# Patient Record
Sex: Male | Born: 1970 | Race: White | Hispanic: No | Marital: Single | Smoking: Current every day smoker
Health system: Southern US, Community
[De-identification: ages and names within clinical notes are randomized; demographics above are authoritative.]

---

## 2000-02-15 ENCOUNTER — Encounter: Admission: RE | Admit: 2000-02-15 | Discharge: 2000-02-15 | Payer: Self-pay | Admitting: Family Medicine

## 2000-02-15 ENCOUNTER — Encounter: Payer: Self-pay | Admitting: Family Medicine

## 2002-01-07 ENCOUNTER — Ambulatory Visit (HOSPITAL_COMMUNITY): Admission: RE | Admit: 2002-01-07 | Discharge: 2002-01-07 | Payer: Self-pay | Admitting: Emergency Medicine

## 2002-01-07 ENCOUNTER — Encounter: Payer: Self-pay | Admitting: Emergency Medicine

## 2002-02-14 ENCOUNTER — Encounter: Payer: Self-pay | Admitting: Emergency Medicine

## 2002-02-14 ENCOUNTER — Inpatient Hospital Stay (HOSPITAL_COMMUNITY): Admission: EM | Admit: 2002-02-14 | Discharge: 2002-02-20 | Payer: Self-pay | Admitting: Emergency Medicine

## 2002-02-16 ENCOUNTER — Encounter: Payer: Self-pay | Admitting: Internal Medicine

## 2010-08-14 ENCOUNTER — Encounter: Payer: Self-pay | Admitting: *Deleted

## 2012-02-04 ENCOUNTER — Ambulatory Visit: Payer: Self-pay | Admitting: Emergency Medicine

## 2013-02-20 ENCOUNTER — Emergency Department: Payer: Self-pay | Admitting: Unknown Physician Specialty

## 2013-02-20 LAB — CBC
HCT: 43.2 % (ref 40.0–52.0)
HGB: 15.4 g/dL (ref 13.0–18.0)
MCH: 32 pg (ref 26.0–34.0)
MCHC: 35.6 g/dL (ref 32.0–36.0)
MCV: 90 fL (ref 80–100)
Platelet: 319 10*3/uL (ref 150–440)
RBC: 4.8 10*6/uL (ref 4.40–5.90)
RDW: 14 % (ref 11.5–14.5)
WBC: 7.3 10*3/uL (ref 3.8–10.6)

## 2013-02-20 LAB — COMPREHENSIVE METABOLIC PANEL
Albumin: 3.9 g/dL (ref 3.4–5.0)
Alkaline Phosphatase: 60 U/L (ref 50–136)
Anion Gap: 6 — ABNORMAL LOW (ref 7–16)
BUN: 14 mg/dL (ref 7–18)
Bilirubin,Total: 0.5 mg/dL (ref 0.2–1.0)
Calcium, Total: 9.2 mg/dL (ref 8.5–10.1)
Chloride: 106 mmol/L (ref 98–107)
Co2: 25 mmol/L (ref 21–32)
Creatinine: 0.74 mg/dL (ref 0.60–1.30)
EGFR (African American): >60
EGFR (Non-African Amer.): >60
Glucose: 95 mg/dL (ref 65–99)
Osmolality: 274 (ref 275–301)
Potassium: 4.1 mmol/L (ref 3.5–5.1)
SGOT(AST): 30 U/L (ref 15–37)
SGPT (ALT): 32 U/L (ref 12–78)
Sodium: 137 mmol/L (ref 136–145)
Total Protein: 7.3 g/dL (ref 6.4–8.2)

## 2013-02-20 LAB — TROPONIN I: Troponin-I: <0.02 ng/mL

## 2013-06-17 IMAGING — CR DG WRIST COMPLETE 3+V*R*
1 series · 4 of 4 positions shown · non-contrast
Comparison: none

REASON FOR EXAM: pain 4 and 5 metacarpal extends to forearm
COMMENTS:

PROCEDURE:     MDR - MDR WRIST RT COMP WITH OBLIQUES  - February 04, 2012 [DATE]
RESULT:     There is no evidence of fracture, dislocation, or malalignment.

[Series 1: pa · 0.17mm/px · 4 of 4 slices shown]
[im 1/4]
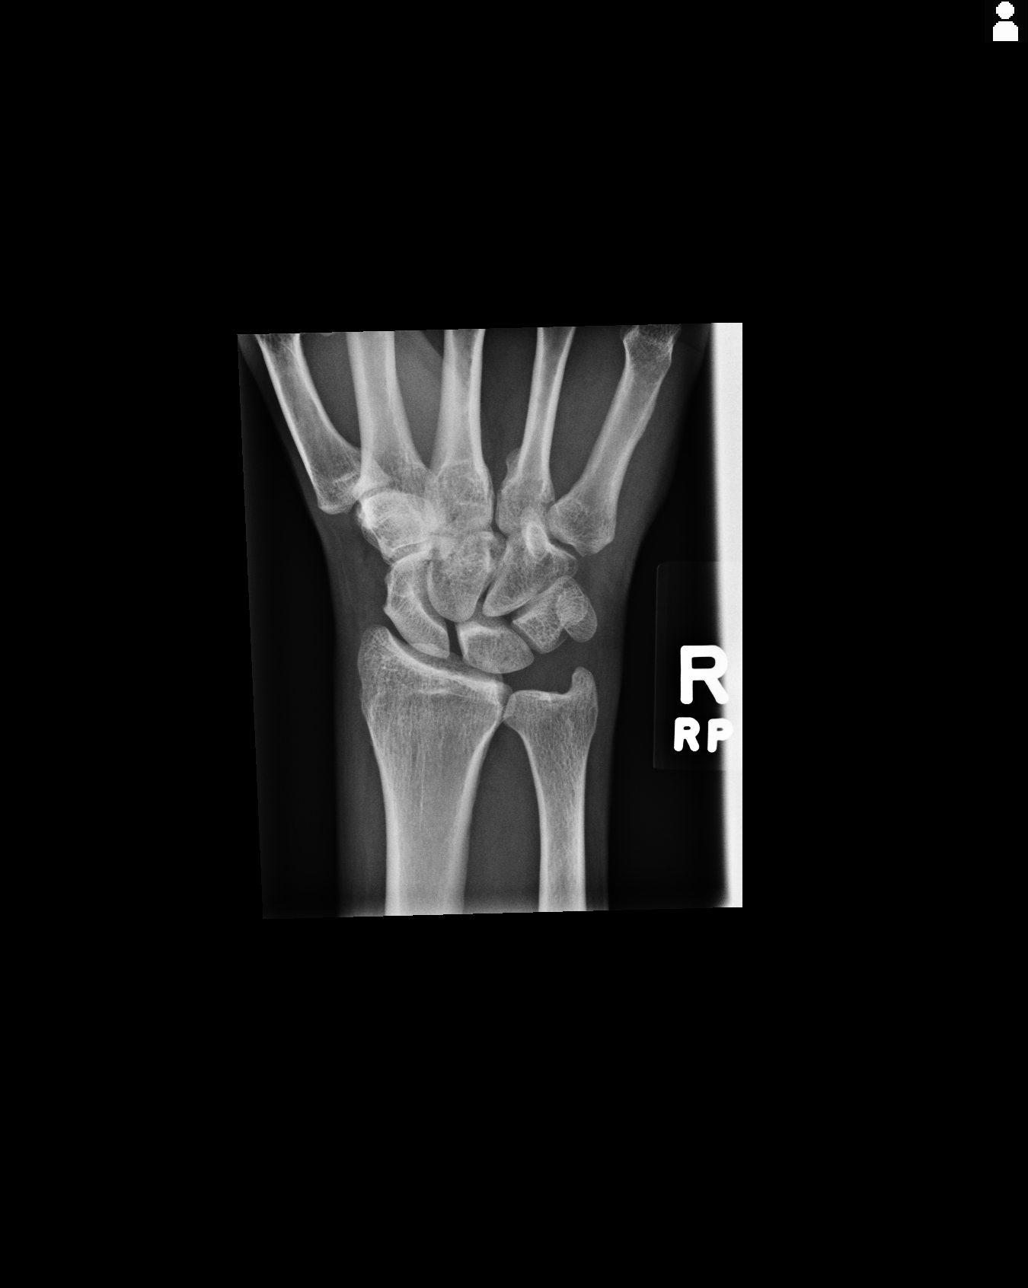
[im 2/4]
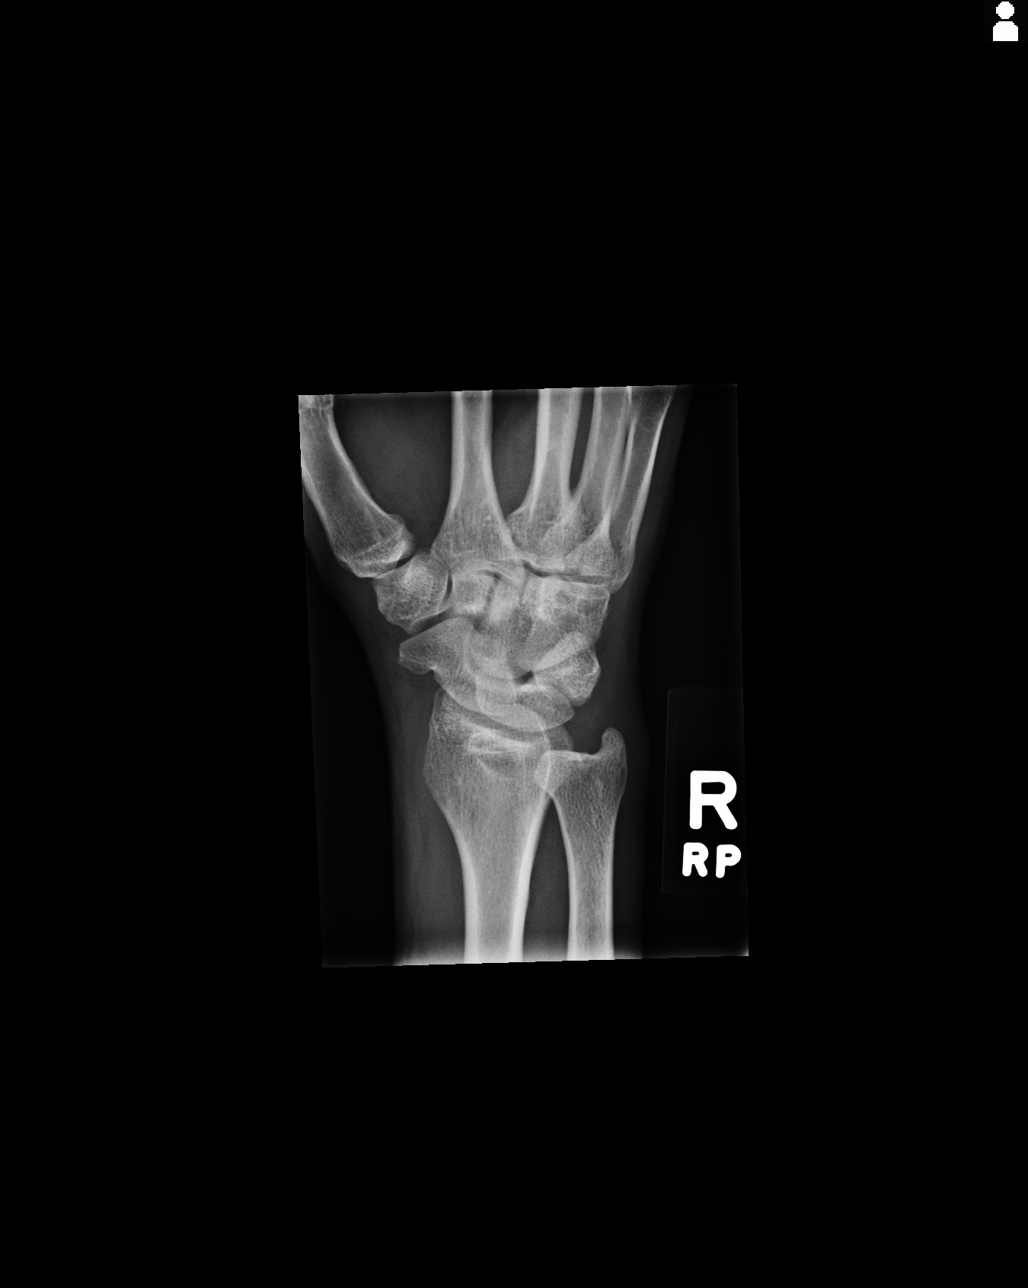
[im 3/4]
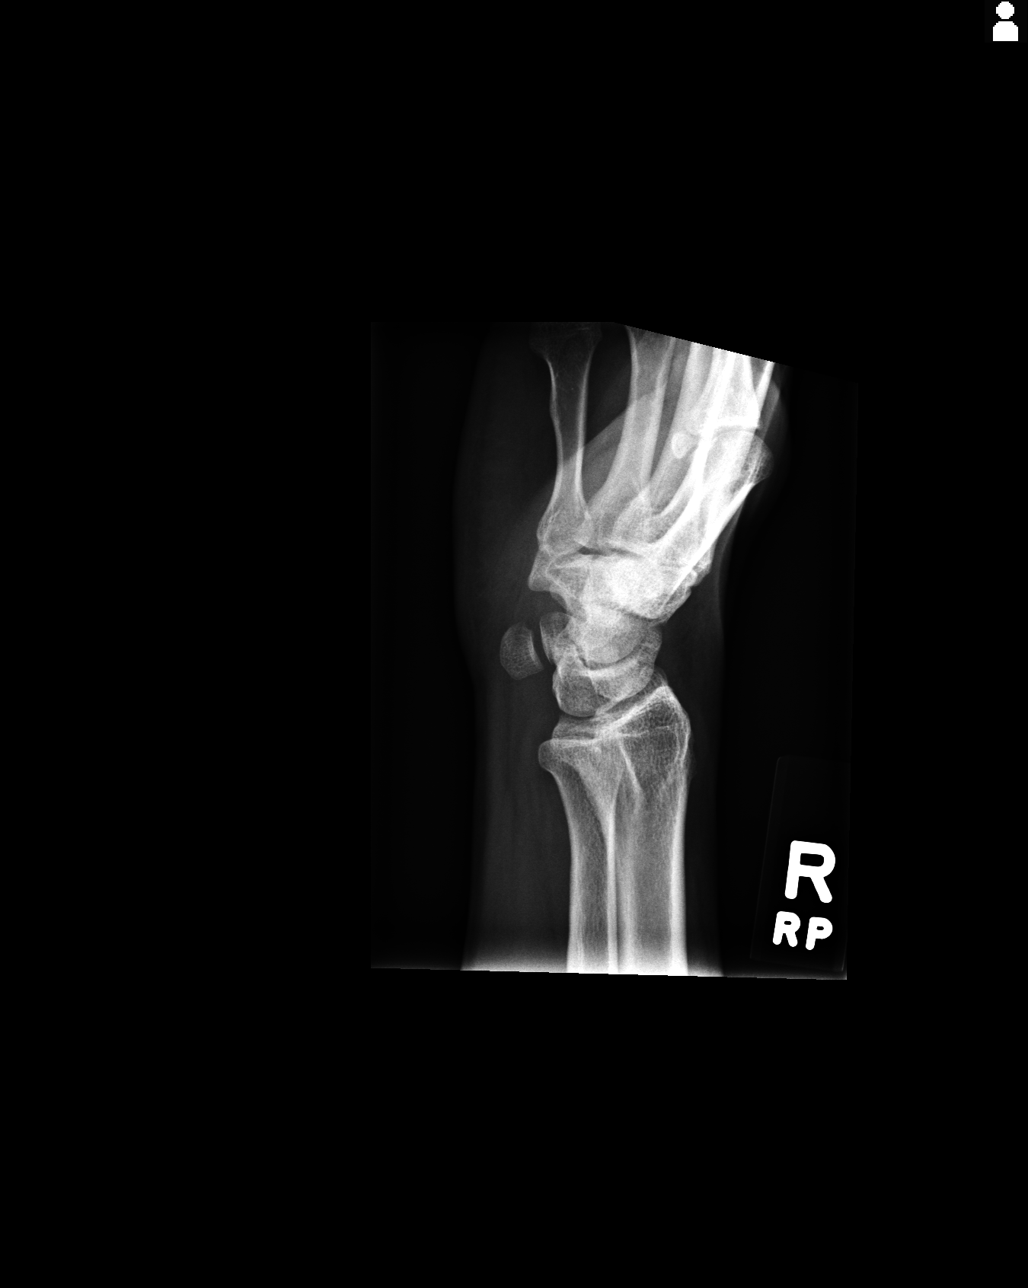
[im 4/4]
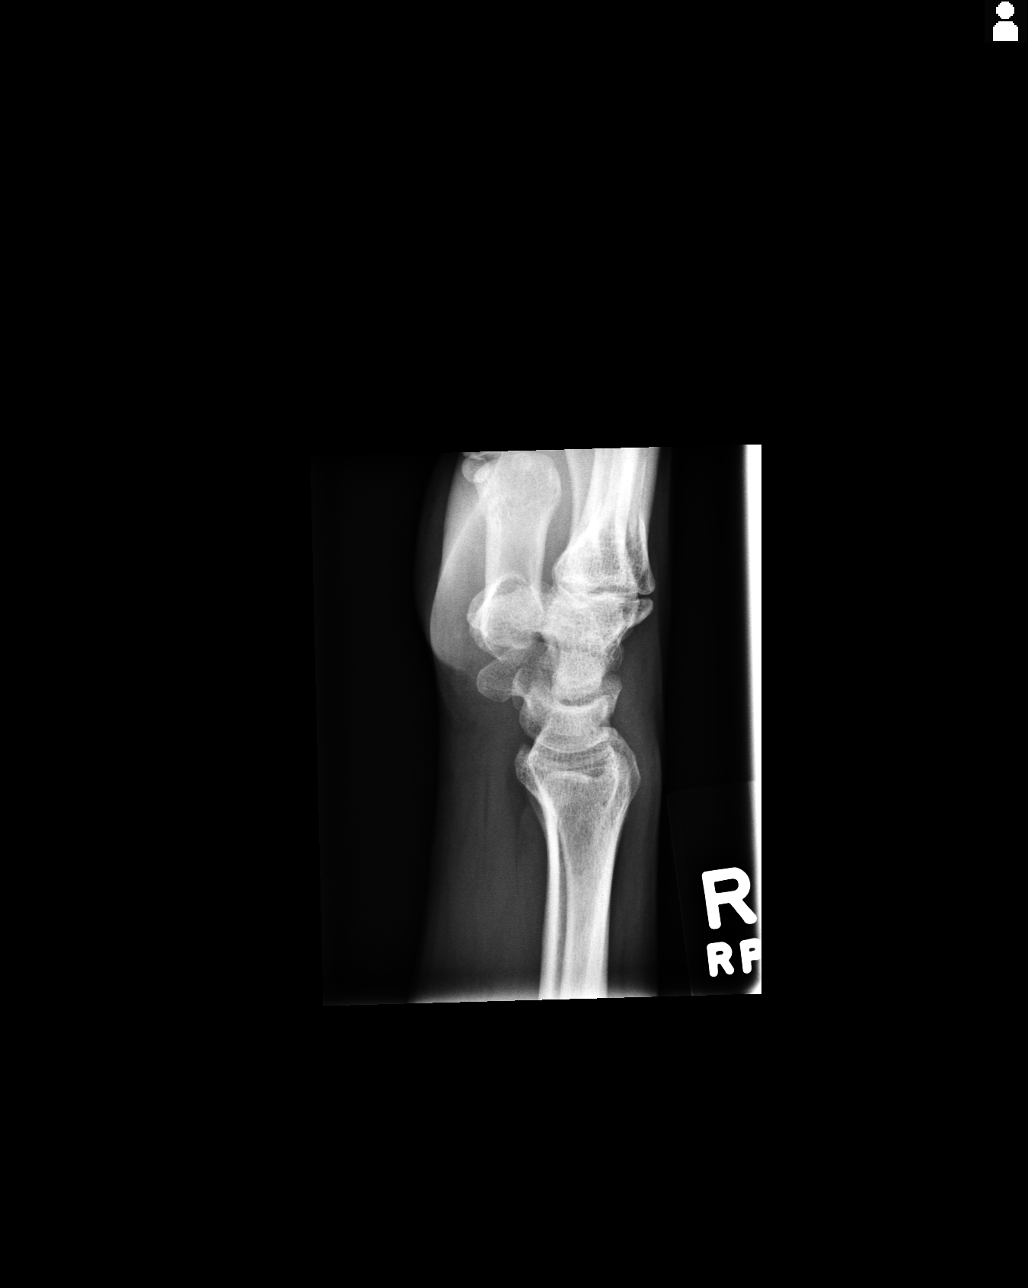

[4 of 4 positions shown; findings below may reference images not displayed]

IMPRESSION: 1. No evidence of acute abnormalities.
2. If there are persistent complaints of pain or persistent clinical
concern, a repeat evaluation in 7-10 days is recommended if clinically
warranted.

## 2014-02-06 ENCOUNTER — Emergency Department: Payer: Self-pay | Admitting: Emergency Medicine

## 2014-07-04 IMAGING — CR DG CHEST 1V PORT
1 series · 2 of 2 positions shown · non-contrast
Comparison: none

REASON FOR EXAM: cp
COMMENTS:

[Series 1: ap · 0.17mm/px · 2 of 2 slices shown]
[im 1/2]
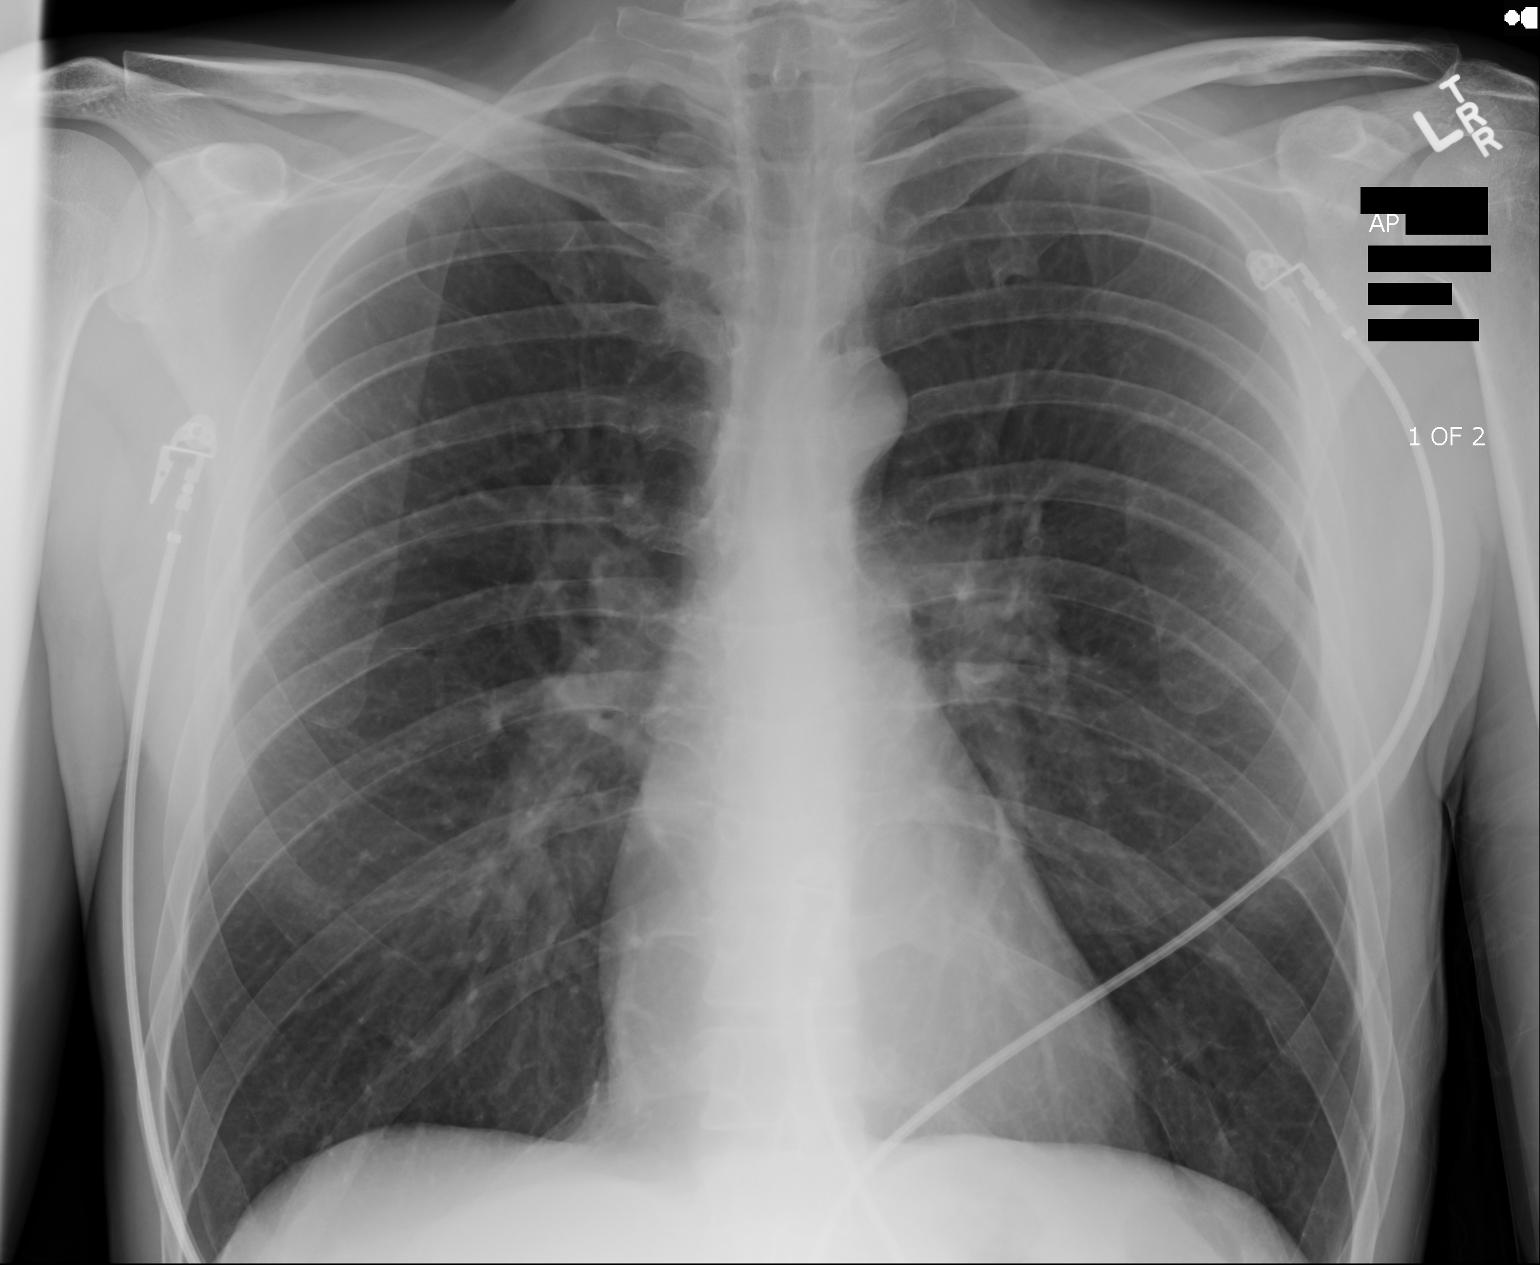
[im 2/2]
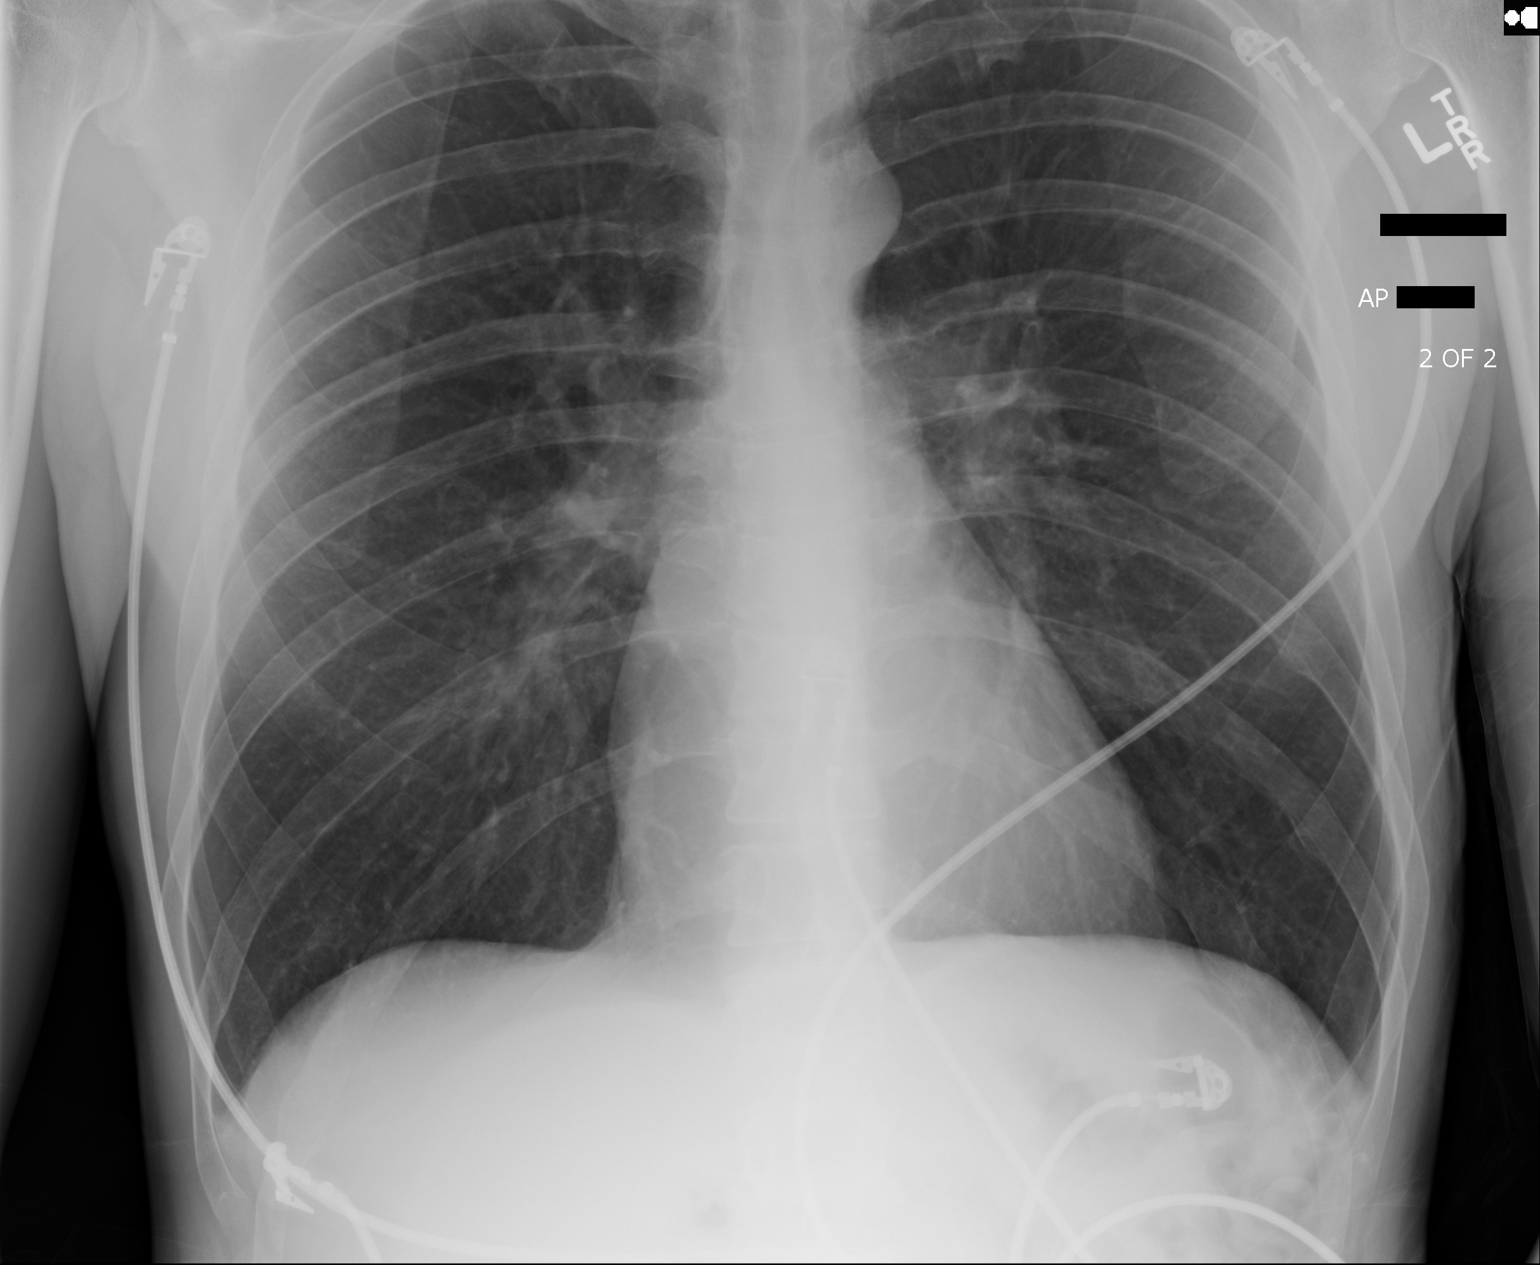

[2 of 2 positions shown; findings below may reference images not displayed]

PROCEDURE:     DXR - DXR PORTABLE CHEST SINGLE VIEW  - February 20, 2013  [DATE]

RESULT:     The lungs are mildly hyperinflated. There is slight hilar
prominence greater on the left than the right. The lungs are otherwise
clear. There is no effusion or pneumothorax. There is no diffuse
interstitial edema. The bony structures are unremarkable. The cardiac
silhouette is normal.
IMPRESSION: Mild hyperinflation suggestive of possible COPD. Hazy
increased density in the left hilar region. Correlate for perihilar
pneumonia.

[REDACTED]

## 2017-02-11 ENCOUNTER — Emergency Department
Admission: EM | Admit: 2017-02-11 | Discharge: 2017-02-11 | Disposition: A | Discharge status: Home / Self Care | Payer: PRIVATE HEALTH INSURANCE | Attending: Emergency Medicine | Admitting: Emergency Medicine

## 2017-02-11 ENCOUNTER — Encounter: Payer: Self-pay | Admitting: Emergency Medicine

## 2017-02-11 DIAGNOSIS — F17200 Nicotine dependence, unspecified, uncomplicated: Secondary | ICD-10-CM | POA: Insufficient documentation

## 2017-02-11 DIAGNOSIS — Y999 Unspecified external cause status: Secondary | ICD-10-CM | POA: Insufficient documentation

## 2017-02-11 DIAGNOSIS — S80861A Insect bite (nonvenomous), right lower leg, initial encounter: Secondary | ICD-10-CM | POA: Insufficient documentation

## 2017-02-11 DIAGNOSIS — S8991XA Unspecified injury of right lower leg, initial encounter: Secondary | ICD-10-CM | POA: Diagnosis present

## 2017-02-11 DIAGNOSIS — Y939 Activity, unspecified: Secondary | ICD-10-CM | POA: Diagnosis not present

## 2017-02-11 DIAGNOSIS — Y929 Unspecified place or not applicable: Secondary | ICD-10-CM | POA: Insufficient documentation

## 2017-02-11 DIAGNOSIS — Z23 Encounter for immunization: Secondary | ICD-10-CM | POA: Insufficient documentation

## 2017-02-11 DIAGNOSIS — W57XXXA Bitten or stung by nonvenomous insect and other nonvenomous arthropods, initial encounter: Secondary | ICD-10-CM | POA: Diagnosis not present

## 2017-02-11 MED ORDER — RANITIDINE HCL 150 MG PO TABS: 150.0000 mg | ORAL_TABLET | Freq: Two times a day (BID) | ORAL | 0 refills | Status: AC

## 2017-02-11 MED ORDER — DIPHENHYDRAMINE HCL 25 MG PO CAPS: 25.0000 mg | ORAL_CAPSULE | ORAL | 0 refills | Status: AC | PRN

## 2017-02-11 MED ORDER — DIPHENHYDRAMINE HCL 50 MG/ML IJ SOLN
25.0000 mg | Freq: Once | INTRAMUSCULAR | Status: AC
  Administered 2017-02-11: 25 mg via INTRAMUSCULAR
  Filled 2017-02-11: qty 1

## 2017-02-11 MED ORDER — FAMOTIDINE 20 MG PO TABS
20.0000 mg | ORAL_TABLET | Freq: Once | ORAL | Status: AC
  Administered 2017-02-11: 20 mg via ORAL
  Filled 2017-02-11: qty 1

## 2017-02-11 MED ORDER — DOXYCYCLINE HYCLATE 50 MG PO CAPS: 100.0000 mg | ORAL_CAPSULE | Freq: Two times a day (BID) | ORAL | 0 refills | Status: AC

## 2017-02-11 MED ORDER — TETANUS-DIPHTH-ACELL PERTUSSIS 5-2.5-18.5 LF-MCG/0.5 IM SUSP
0.5000 mL | Freq: Once | INTRAMUSCULAR | Status: AC
Start: 2017-02-11 — End: 2017-02-11
  Administered 2017-02-11: 0.5 mL via INTRAMUSCULAR
  Filled 2017-02-11: qty 0.5

## 2017-02-11 MED ORDER — PREDNISONE 10 MG PO TABS: ORAL_TABLET | ORAL | 0 refills | Status: AC

## 2017-02-11 MED ORDER — METHYLPREDNISOLONE SODIUM SUCC 125 MG IJ SOLR
125.0000 mg | Freq: Once | INTRAMUSCULAR | Status: AC
  Administered 2017-02-11: 125 mg via INTRAMUSCULAR
  Filled 2017-02-11: qty 2

## 2017-02-11 NOTE — ED Triage Notes (Signed)
Pt got stung in right hand by wasp last night. Swelling to right hand. No SHOB per pt.  Took benadryl last night but nothing today. No redness noted. Ambulatory to triage. VSS

## 2017-02-11 NOTE — ED Notes (Signed)
See triage note   States he was stung yesterday on hand  Hand swelling noted   No resp distress note

## 2017-02-11 NOTE — ED Provider Notes (Signed)
[facility] Emergency Department Provider Note  ____________________________________________  Time seen: Approximately 8:53 AM  I have reviewed the triage vital signs and the nursing notes.   HISTORY  Chief Complaint Insect Bite    HPI Benjamin Barron is a 46 y.o. male that presents to the emergency department for evaluation after a wasp sting. Patient was stung by a wasp in his right hand yesterday. He took 2 doses of Benadryl last night. Hand was more swollen this morning. He did not take any medication this morning.No difficulty breathing. 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 He states that the "hole" from tick bite lasts for about 2 months before it heals. He currently has a bite on his right calf from a tick. Area recently started draining yellow drainage. He remembers there was a rash around the bite but cannot describe rash. It is scaling around the bite. He denies fever, hives, shortness of breath, chest pain, nausea, vomiting, abdominal pain.   History reviewed. No pertinent past medical history.  There are no active problems to display for this patient.   History reviewed. No pertinent surgical history.  Prior to Admission medications   Medication Sig Start Date End Date Taking? Authorizing Provider  diphenhydrAMINE (BENADRYL) 25 mg capsule Take 1 capsule (25 mg total) by mouth every 4 (four) hours as needed. 02/11/17 02/11/18  Enid Derry, PA-C  doxycycline (VIBRAMYCIN) 50 MG capsule Take 2 capsules (100 mg total) by mouth 2 (two) times daily. 02/11/17 02/21/17  Enid Derry, PA-C  predniSONE (DELTASONE) 10 MG tablet Take 6 tablets on day 1, take 5 tablets on day 2, take 4 tablets on day 3, take 3 tablets on day 4, take 2 tablets on day 5, take 1 tablet on day 6 02/11/17   Enid Derry, PA-C  ranitidine (ZANTAC) 150 MG tablet Take 1 tablet (150 mg total) by  mouth 2 (two) times daily. 02/11/17 02/11/18  Enid Derry, PA-C    Allergies Patient has no known allergies.  History reviewed. No pertinent family history.  Social History Social History  Substance Use Topics  . Smoking status: Current Every Day Smoker  . Smokeless tobacco: Never Used  . Alcohol use Yes     Review of Systems  Constitutional: No fever/chills Cardiovascular: No chest pain. Respiratory: No SOB. Gastrointestinal: No abdominal pain.  No nausea, no vomiting.  Musculoskeletal: Negative for musculoskeletal pain. Skin: Negative for ecchymosis. Neurological: Negative for headaches, numbness or tingling   ____________________________________________   PHYSICAL EXAM:  VITAL SIGNS: ED Triage Vitals [02/11/17 0834]  Enc Vitals Group     BP 100/71     Pulse Rate 81     Resp 18     Temp 98.8 F (37.1 C)     Temp Source Oral     SpO2 97 %     Weight 155 lb (70.3 kg)     Height 6\' 2"  (1.88 m)     Head Circumference      Peak Flow      Pain Score 8     Pain Loc      Pain Edu?      Excl. in GC?      Constitutional: Alert and oriented. Well appearing and in no acute distress. Eyes: Conjunctivae are normal. PERRL. EOMI. Head: Atraumatic. ENT:      Ears:  Nose: No congestion/rhinnorhea.      Mouth/Throat: Mucous membranes are moist.  Neck: No stridor.  Cardiovascular: Normal rate, regular rhythm.  Good peripheral circulation. 2+ radial pulses. Respiratory: Normal respiratory effort without tachypnea or retractions. Lungs CTAB. Good air entry to the bases with no decreased or absent breath sounds. Musculoskeletal: Full range of motion to all extremities. Moderate swelling to right hand. Compartments are soft. Neurologic:  Normal speech and language. No gross focal neurologic deficits are appreciated.  Skin:  Skin is warm, dry. 2  mm indentation to right calf with 1 cm of surrounding scaling. Minimal yellow drainage present. No  erythema.   ____________________________________________   LABS (all labs ordered are listed, but only abnormal results are displayed)  Labs Reviewed - No data to display ____________________________________________  EKG   ____________________________________________  RADIOLOGY  No results found.  ____________________________________________    PROCEDURES  Procedure(s) performed:    Procedures    Medications  methylPREDNISolone sodium succinate (SOLU-MEDROL) 125 mg/2 mL injection 125 mg (125 mg Intramuscular Given 02/11/17 0917)  diphenhydrAMINE (BENADRYL) injection 25 mg (25 mg Intramuscular Given 02/11/17 0918)  famotidine (PEPCID) tablet 20 mg (20 mg Oral Given 02/11/17 0917)  Tdap (BOOSTRIX) injection 0.5 mL (0.5 mLs Intramuscular Given 02/11/17 1014)     ____________________________________________   INITIAL IMPRESSION / ASSESSMENT AND PLAN / ED COURSE  Pertinent labs & imaging results that were available during my care of the patient were reviewed by me and considered in my medical decision making (see chart for details).  Review of the [place] CSRS was performed in accordance of the [facility] prior to dispensing any controlled drugs.   Patient presented to the emergency department for evaluation of wasp sting.  Vital signs and exam are reassuring. He was given IM prednisone, Benadryl and oral Pepcid in ED. He also has concerns for a tick bite to right calf. It has been present for 1+ month had some yellow discharge in the last 2 days. He did say there was a rash around bite was unable to describe rash. I will cover for Lyme's with doxycycline. Patient will be discharged home with prescriptions for prednisone, Benadryl, ranitidine, doxycycline. Patient is to follow up with PCP as directed. Patient is given ED precautions to return to the ED for any worsening or new symptoms.     ____________________________________________  FINAL CLINICAL IMPRESSION(S) / ED  DIAGNOSES  Final diagnoses:  Insect bite, initial encounter  Tick bite, initial encounter      NEW MEDICATIONS STARTED DURING THIS VISIT:  Discharge Medication List as of 02/11/2017 10:07 AM    START taking these medications   Details  diphenhydrAMINE (BENADRYL) 25 mg capsule Take 1 capsule (25 mg total) by mouth every 4 (four) hours as needed., Starting Mon 02/11/2017, Until Tue 02/11/2018, Print    doxycycline (VIBRAMYCIN) 50 MG capsule Take 2 capsules (100 mg total) by mouth 2 (two) times daily., Starting Mon 02/11/2017, Until Thu 02/21/2017, Print    predniSONE (DELTASONE) 10 MG tablet Take 6 tablets on day 1, take 5 tablets on day 2, take 4 tablets on day 3, take 3 tablets on day 4, take 2 tablets on day 5, take 1 tablet on day 6, Print    ranitidine (ZANTAC) 150 MG tablet Take 1 tablet (150 mg total) by mouth 2 (two) times daily., Starting Mon 02/11/2017, Until Tue 02/11/2018, Print            This chart was dictated using voice recognition software/Dragon. Despite best  efforts to proofread, errors can occur which can change the meaning. Any change was purely unintentional.    Enid DerryWagner, Syriah Delisi, PA-C 02/11/17 1531    Arnaldo NatalMalinda, Paul F, MD 02/11/17 1534

## 2017-02-11 NOTE — ED Notes (Addendum)
States he was stung by wasp last evening.  Right hand swollen.

## 2024-03-26 ENCOUNTER — Other Ambulatory Visit: Payer: Self-pay | Admitting: Family Medicine

## 2024-03-26 DIAGNOSIS — M19011 Primary osteoarthritis, right shoulder: Secondary | ICD-10-CM

## 2024-03-27 ENCOUNTER — Ambulatory Visit
Admission: RE | Admit: 2024-03-27 | Discharge: 2024-03-27 | Disposition: A | Discharge status: Home / Self Care | Source: Ambulatory Visit | Attending: Family Medicine | Admitting: Family Medicine

## 2024-03-27 DIAGNOSIS — M19011 Primary osteoarthritis, right shoulder: Secondary | ICD-10-CM

## 2024-03-30 ENCOUNTER — Other Ambulatory Visit: Payer: Self-pay | Admitting: Family Medicine

## 2024-03-30 DIAGNOSIS — M5412 Radiculopathy, cervical region: Secondary | ICD-10-CM

## 2024-04-01 ENCOUNTER — Ambulatory Visit
Admission: RE | Admit: 2024-04-01 | Discharge: 2024-04-01 | Disposition: A | Discharge status: Home / Self Care | Source: Ambulatory Visit | Attending: Family Medicine | Admitting: Family Medicine

## 2024-04-01 DIAGNOSIS — M5412 Radiculopathy, cervical region: Secondary | ICD-10-CM

## 2024-04-02 ENCOUNTER — Other Ambulatory Visit

## 2024-04-02 ENCOUNTER — Other Ambulatory Visit: Payer: Self-pay | Admitting: Family Medicine

## 2024-04-02 DIAGNOSIS — S32010A Wedge compression fracture of first lumbar vertebra, initial encounter for closed fracture: Secondary | ICD-10-CM

## 2024-04-04 ENCOUNTER — Ambulatory Visit
Admission: RE | Admit: 2024-04-04 | Discharge: 2024-04-04 | Disposition: A | Discharge status: Home / Self Care | Source: Ambulatory Visit | Attending: Family Medicine | Admitting: Family Medicine

## 2024-04-04 DIAGNOSIS — S32010A Wedge compression fracture of first lumbar vertebra, initial encounter for closed fracture: Secondary | ICD-10-CM

## 2024-05-13 ENCOUNTER — Other Ambulatory Visit: Payer: Self-pay | Admitting: Emergency Medicine

## 2024-05-13 DIAGNOSIS — R911 Solitary pulmonary nodule: Secondary | ICD-10-CM

## 2024-05-13 DIAGNOSIS — F17210 Nicotine dependence, cigarettes, uncomplicated: Secondary | ICD-10-CM

## 2024-05-19 ENCOUNTER — Other Ambulatory Visit

## 2024-05-29 ENCOUNTER — Other Ambulatory Visit

## 2024-06-16 ENCOUNTER — Ambulatory Visit
Admission: RE | Admit: 2024-06-16 | Discharge: 2024-06-16 | Disposition: A | Source: Ambulatory Visit | Attending: Emergency Medicine

## 2024-06-16 DIAGNOSIS — R911 Solitary pulmonary nodule: Secondary | ICD-10-CM

## 2024-06-16 DIAGNOSIS — F17210 Nicotine dependence, cigarettes, uncomplicated: Secondary | ICD-10-CM

## 2024-07-07 ENCOUNTER — Other Ambulatory Visit: Payer: Self-pay | Admitting: Emergency Medicine

## 2024-07-07 DIAGNOSIS — F17210 Nicotine dependence, cigarettes, uncomplicated: Secondary | ICD-10-CM

## 2024-07-07 DIAGNOSIS — R911 Solitary pulmonary nodule: Secondary | ICD-10-CM

## 2024-07-15 ENCOUNTER — Ambulatory Visit
Admission: RE | Admit: 2024-07-15 | Discharge: 2024-07-15 | Disposition: A | Discharge status: Home / Self Care | Source: Ambulatory Visit | Attending: Emergency Medicine | Admitting: Emergency Medicine

## 2024-07-15 DIAGNOSIS — R911 Solitary pulmonary nodule: Secondary | ICD-10-CM

## 2024-07-15 DIAGNOSIS — F17210 Nicotine dependence, cigarettes, uncomplicated: Secondary | ICD-10-CM

## 2024-07-22 ENCOUNTER — Ambulatory Visit: Admission: RE | Admit: 2024-07-22 | Source: Ambulatory Visit

## 2024-07-22 DIAGNOSIS — F17210 Nicotine dependence, cigarettes, uncomplicated: Secondary | ICD-10-CM | POA: Diagnosis not present

## 2024-07-22 DIAGNOSIS — R911 Solitary pulmonary nodule: Secondary | ICD-10-CM | POA: Diagnosis present

## 2024-07-22 DIAGNOSIS — J439 Emphysema, unspecified: Secondary | ICD-10-CM | POA: Insufficient documentation

## 2024-07-22 DIAGNOSIS — I700 Atherosclerosis of aorta: Secondary | ICD-10-CM | POA: Insufficient documentation

## 2024-07-22 LAB — GLUCOSE, CAPILLARY: Glucose-Capillary: 108 mg/dL — ABNORMAL HIGH (ref 70–99)

## 2024-07-22 MED ORDER — FLUDEOXYGLUCOSE F - 18 (FDG) INJECTION
8.2600 | Freq: Once | INTRAVENOUS | Status: AC | PRN
  Administered 2024-07-22: 8.26 via INTRAVENOUS
